# Patient Record
Sex: Female | Born: 1945 | Race: White | Hispanic: No | Marital: Married
Health system: Southern US, Community
[De-identification: ages and names within clinical notes are randomized; demographics above are authoritative.]

---

## 1997-09-19 ENCOUNTER — Ambulatory Visit (HOSPITAL_COMMUNITY): Admission: RE | Admit: 1997-09-19 | Discharge: 1997-09-19 | Payer: Self-pay | Admitting: Obstetrics and Gynecology

## 1998-03-27 ENCOUNTER — Other Ambulatory Visit: Admission: RE | Admit: 1998-03-27 | Discharge: 1998-03-27 | Payer: Self-pay | Admitting: Obstetrics and Gynecology

## 1998-09-23 ENCOUNTER — Ambulatory Visit (HOSPITAL_COMMUNITY): Admission: RE | Admit: 1998-09-23 | Discharge: 1998-09-23 | Payer: Self-pay | Admitting: Obstetrics and Gynecology

## 1998-09-23 ENCOUNTER — Other Ambulatory Visit: Admission: RE | Admit: 1998-09-23 | Discharge: 1998-09-23 | Payer: Self-pay | Admitting: Obstetrics and Gynecology

## 1998-09-23 ENCOUNTER — Encounter: Payer: Self-pay | Admitting: Obstetrics and Gynecology

## 1999-10-06 ENCOUNTER — Ambulatory Visit (HOSPITAL_COMMUNITY): Admission: RE | Admit: 1999-10-06 | Discharge: 1999-10-06 | Payer: Self-pay | Admitting: Obstetrics and Gynecology

## 1999-10-06 ENCOUNTER — Encounter: Payer: Self-pay | Admitting: Obstetrics and Gynecology

## 1999-10-06 ENCOUNTER — Other Ambulatory Visit: Admission: RE | Admit: 1999-10-06 | Discharge: 1999-10-06 | Payer: Self-pay | Admitting: Obstetrics and Gynecology

## 1999-10-30 ENCOUNTER — Other Ambulatory Visit: Admission: RE | Admit: 1999-10-30 | Discharge: 1999-10-30 | Payer: Self-pay | Admitting: Obstetrics and Gynecology

## 1999-10-30 ENCOUNTER — Encounter (INDEPENDENT_AMBULATORY_CARE_PROVIDER_SITE_OTHER): Payer: Self-pay | Admitting: Specialist

## 2000-03-04 ENCOUNTER — Ambulatory Visit (HOSPITAL_COMMUNITY): Admission: RE | Admit: 2000-03-04 | Discharge: 2000-03-04 | Payer: Self-pay | Admitting: Obstetrics and Gynecology

## 2000-03-04 ENCOUNTER — Encounter (INDEPENDENT_AMBULATORY_CARE_PROVIDER_SITE_OTHER): Payer: Self-pay

## 2000-10-20 ENCOUNTER — Encounter: Payer: Self-pay | Admitting: Obstetrics and Gynecology

## 2000-10-20 ENCOUNTER — Ambulatory Visit (HOSPITAL_COMMUNITY): Admission: RE | Admit: 2000-10-20 | Discharge: 2000-10-20 | Payer: Self-pay | Admitting: Obstetrics and Gynecology

## 2001-01-19 ENCOUNTER — Other Ambulatory Visit: Admission: RE | Admit: 2001-01-19 | Discharge: 2001-01-19 | Payer: Self-pay | Admitting: Family Medicine

## 2001-11-28 ENCOUNTER — Encounter: Payer: Self-pay | Admitting: Obstetrics and Gynecology

## 2001-11-28 ENCOUNTER — Ambulatory Visit (HOSPITAL_COMMUNITY): Admission: RE | Admit: 2001-11-28 | Discharge: 2001-11-28 | Payer: Self-pay | Admitting: Obstetrics and Gynecology

## 2002-01-22 ENCOUNTER — Other Ambulatory Visit: Admission: RE | Admit: 2002-01-22 | Discharge: 2002-01-22 | Payer: Self-pay | Admitting: Obstetrics and Gynecology

## 2003-02-07 ENCOUNTER — Ambulatory Visit (HOSPITAL_COMMUNITY): Admission: RE | Admit: 2003-02-07 | Discharge: 2003-02-07 | Payer: Self-pay | Admitting: Obstetrics and Gynecology

## 2003-02-07 ENCOUNTER — Other Ambulatory Visit: Admission: RE | Admit: 2003-02-07 | Discharge: 2003-02-07 | Payer: Self-pay | Admitting: Obstetrics and Gynecology

## 2003-03-20 ENCOUNTER — Other Ambulatory Visit: Admission: RE | Admit: 2003-03-20 | Discharge: 2003-03-20 | Payer: Self-pay | Admitting: Obstetrics and Gynecology

## 2003-09-16 ENCOUNTER — Other Ambulatory Visit: Admission: RE | Admit: 2003-09-16 | Discharge: 2003-09-16 | Payer: Self-pay | Admitting: Obstetrics and Gynecology

## 2004-03-19 ENCOUNTER — Ambulatory Visit (HOSPITAL_COMMUNITY): Admission: RE | Admit: 2004-03-19 | Discharge: 2004-03-19 | Payer: Self-pay | Admitting: Obstetrics and Gynecology

## 2004-03-26 ENCOUNTER — Other Ambulatory Visit: Admission: RE | Admit: 2004-03-26 | Discharge: 2004-03-26 | Payer: Self-pay | Admitting: Obstetrics and Gynecology

## 2004-09-24 ENCOUNTER — Other Ambulatory Visit: Admission: RE | Admit: 2004-09-24 | Discharge: 2004-09-24 | Payer: Self-pay | Admitting: Obstetrics and Gynecology

## 2005-03-24 ENCOUNTER — Ambulatory Visit (HOSPITAL_COMMUNITY): Admission: RE | Admit: 2005-03-24 | Discharge: 2005-03-24 | Payer: Self-pay | Admitting: Obstetrics and Gynecology

## 2005-04-16 ENCOUNTER — Other Ambulatory Visit: Admission: RE | Admit: 2005-04-16 | Discharge: 2005-04-16 | Payer: Self-pay | Admitting: Obstetrics and Gynecology

## 2006-04-07 ENCOUNTER — Ambulatory Visit (HOSPITAL_COMMUNITY): Admission: RE | Admit: 2006-04-07 | Discharge: 2006-04-07 | Payer: Self-pay | Admitting: Obstetrics and Gynecology

## 2006-07-04 ENCOUNTER — Other Ambulatory Visit: Admission: RE | Admit: 2006-07-04 | Discharge: 2006-07-04 | Payer: Self-pay | Admitting: Obstetrics and Gynecology

## 2007-02-15 ENCOUNTER — Other Ambulatory Visit: Admission: RE | Admit: 2007-02-15 | Discharge: 2007-02-15 | Payer: Self-pay | Admitting: Obstetrics and Gynecology

## 2007-04-13 ENCOUNTER — Ambulatory Visit (HOSPITAL_COMMUNITY): Admission: RE | Admit: 2007-04-13 | Discharge: 2007-04-13 | Payer: Self-pay | Admitting: Obstetrics and Gynecology

## 2007-07-05 ENCOUNTER — Other Ambulatory Visit: Admission: RE | Admit: 2007-07-05 | Discharge: 2007-07-05 | Payer: Self-pay | Admitting: Obstetrics and Gynecology

## 2007-07-17 ENCOUNTER — Encounter: Admission: RE | Admit: 2007-07-17 | Discharge: 2007-07-17 | Payer: Self-pay | Admitting: Obstetrics and Gynecology

## 2008-07-09 ENCOUNTER — Other Ambulatory Visit: Admission: RE | Admit: 2008-07-09 | Discharge: 2008-07-09 | Payer: Self-pay | Admitting: Obstetrics and Gynecology

## 2008-07-18 ENCOUNTER — Ambulatory Visit (HOSPITAL_COMMUNITY): Admission: RE | Admit: 2008-07-18 | Discharge: 2008-07-18 | Payer: Self-pay | Admitting: Obstetrics and Gynecology

## 2009-07-25 ENCOUNTER — Ambulatory Visit (HOSPITAL_COMMUNITY): Admission: RE | Admit: 2009-07-25 | Discharge: 2009-07-25 | Payer: Self-pay | Admitting: Obstetrics and Gynecology

## 2010-07-17 NOTE — H&P (Signed)
[facility]  Patient:    Leslie Hoffman, Leslie Hoffman                          MRN: [id number] Attending:  Beather Arbour. Thomasena Edis, M.D.                         History and Physical  HISTORY OF PRESENT ILLNESS:   The patient is a 65 year old gravida 2, para 2, Caucasian female who, in August, began to manifest postmenopausal bleeding; this occurred after the pharmacy substituted her hormone-replacement therapy; in fact, the substitution was for [facility] and it was not an Estratest-type product.  The patient subsequently continued to have daily bleeding, pink and bright red.  She subsequently underwent a sonohysterogram, which was negative. Endometrial biopsy was performed and showed blood and scantly weakly proliferative endometrium.  She continued to manifest daily bleeding despite her Estrace, Estratest and adequate doses of progesterone.  She is thus admitted for D&C/hysteroscopy to rule out endometrial hyperplasia.  PAST MEDICAL HISTORY:         History significant for hypertension, decreased HDL, cryosurgery, GERD and depression.  Patient has a history of genital warts and also underwent a D&C in 1976 and again in 1998 for postmenopausal bleeding and to remove endometrial polyps.  MEDICATIONS:                  1. Zocor 20 mg daily.                               2. HCTZ.                               3. Prevacid 30 mg daily.                               4. Paxil 20 mg daily.                               5. Full-strength Estratest one every other day.                               6. Estrace 1 mg every other day, which                                  alternates with the Estratest.                               7. Aygestin 2.5 mg daily.  FAMILY HISTORY:               Family history significant for diabetes mellitus in the patients father, controlled by diet, and history of a CVA in the patients father at age 54 which resulted in his death.  History of brother with  an MI who died at age 20 and mother with tuberculosis.  ALLERGIES:                    CODEINE causes nausea and I have explained this is not a  true allergy.  REVIEW OF SYSTEMS:            Negative.  PHYSICAL EXAMINATION  HEENT:                        Normal.  NECK:                         Supple without thyromegaly.  LUNGS:                        Clear to auscultation.  CARDIAC:                      Regular rate and rhythm.  ABDOMEN:                      Soft, nontender.  No hepatosplenomegaly.  PELVIC:                       Uterus approximately 8- to 9-week-size, anteverted, without any adnexal mass palpated.  RECTAL:                       Rectal is confirmatory, no mass.  ASSESSMENT AND PLAN:          The patient is a 65 year old gravida 2, para 2, white female with continued postmenopausal bleeding which began this summer when the pharmacy substituted the wrong hormone-replacement therapy.  Patient is admitted for dilatation and curettage and hysteroscopy.  Risks of surgery including anesthetic complications, hemorrhage or infection, damage to adjacent structures including bladder, bowel, blood vessels or ureters were discussed with patient.  She is made aware of the risks of uterine perforation which could result in overwhelming life-threatening hemorrhage or an emergent hysterectomy or uterine perforation which could result in bowel damage requiring emergent colostomy, or which could result in overwhelming life-threatening peritonitis.  Patient expresses understanding of and acceptance of these risks. DD:  03/04/00 TD:  03/04/00 Job: [id number]

## 2010-07-21 ENCOUNTER — Other Ambulatory Visit (HOSPITAL_COMMUNITY): Payer: Self-pay | Admitting: Pharmacist

## 2010-07-21 ENCOUNTER — Other Ambulatory Visit (HOSPITAL_COMMUNITY): Payer: Self-pay | Admitting: Family Medicine

## 2010-07-21 DIAGNOSIS — Z1231 Encounter for screening mammogram for malignant neoplasm of breast: Secondary | ICD-10-CM

## 2010-08-18 ENCOUNTER — Ambulatory Visit (HOSPITAL_COMMUNITY)
Admission: RE | Admit: 2010-08-18 | Discharge: 2010-08-18 | Disposition: A | Discharge status: Home / Self Care | Payer: Medicare Other | Source: Ambulatory Visit | Attending: Family Medicine | Admitting: Family Medicine

## 2010-08-18 DIAGNOSIS — Z1231 Encounter for screening mammogram for malignant neoplasm of breast: Secondary | ICD-10-CM

## 2010-10-27 ENCOUNTER — Ambulatory Visit (HOSPITAL_COMMUNITY): Payer: Medicare Other | Attending: Neurology

## 2010-10-27 DIAGNOSIS — G2581 Restless legs syndrome: Secondary | ICD-10-CM | POA: Insufficient documentation

## 2010-11-03 ENCOUNTER — Encounter (HOSPITAL_COMMUNITY): Payer: Medicare Other | Attending: Neurology

## 2010-11-03 DIAGNOSIS — G2581 Restless legs syndrome: Secondary | ICD-10-CM | POA: Insufficient documentation

## 2011-08-03 ENCOUNTER — Other Ambulatory Visit (HOSPITAL_COMMUNITY): Payer: Self-pay | Admitting: Obstetrics and Gynecology

## 2011-08-03 DIAGNOSIS — Z1231 Encounter for screening mammogram for malignant neoplasm of breast: Secondary | ICD-10-CM

## 2011-08-27 ENCOUNTER — Ambulatory Visit (HOSPITAL_COMMUNITY)
Admission: RE | Admit: 2011-08-27 | Discharge: 2011-08-27 | Disposition: A | Discharge status: Home / Self Care | Payer: Medicare Other | Source: Ambulatory Visit | Attending: Obstetrics and Gynecology | Admitting: Obstetrics and Gynecology

## 2011-08-27 DIAGNOSIS — Z1231 Encounter for screening mammogram for malignant neoplasm of breast: Secondary | ICD-10-CM | POA: Insufficient documentation

## 2012-08-16 ENCOUNTER — Other Ambulatory Visit (HOSPITAL_COMMUNITY): Payer: Self-pay | Admitting: Family Medicine

## 2012-08-16 DIAGNOSIS — Z1231 Encounter for screening mammogram for malignant neoplasm of breast: Secondary | ICD-10-CM

## 2012-09-20 ENCOUNTER — Ambulatory Visit (HOSPITAL_COMMUNITY)
Admission: RE | Admit: 2012-09-20 | Discharge: 2012-09-20 | Disposition: A | Discharge status: Home / Self Care | Payer: Medicare Other | Source: Ambulatory Visit | Attending: Family Medicine | Admitting: Family Medicine

## 2012-09-20 DIAGNOSIS — Z1231 Encounter for screening mammogram for malignant neoplasm of breast: Secondary | ICD-10-CM | POA: Insufficient documentation

## 2012-11-23 ENCOUNTER — Other Ambulatory Visit: Payer: Self-pay | Admitting: Neurology

## 2012-12-25 ENCOUNTER — Other Ambulatory Visit: Payer: Self-pay | Admitting: Neurology

## 2013-09-05 ENCOUNTER — Other Ambulatory Visit (HOSPITAL_COMMUNITY): Payer: Self-pay | Admitting: Family Medicine

## 2013-09-05 DIAGNOSIS — Z1231 Encounter for screening mammogram for malignant neoplasm of breast: Secondary | ICD-10-CM

## 2013-09-21 ENCOUNTER — Ambulatory Visit (HOSPITAL_COMMUNITY)
Admission: RE | Admit: 2013-09-21 | Discharge: 2013-09-21 | Disposition: A | Discharge status: Home / Self Care | Payer: Medicare Other | Source: Ambulatory Visit | Attending: Family Medicine | Admitting: Family Medicine

## 2013-09-21 DIAGNOSIS — Z1231 Encounter for screening mammogram for malignant neoplasm of breast: Secondary | ICD-10-CM | POA: Diagnosis present

## 2014-12-02 ENCOUNTER — Other Ambulatory Visit: Payer: Self-pay

## 2014-12-02 DIAGNOSIS — Z1231 Encounter for screening mammogram for malignant neoplasm of breast: Secondary | ICD-10-CM

## 2015-01-02 ENCOUNTER — Ambulatory Visit
Admission: RE | Admit: 2015-01-02 | Discharge: 2015-01-02 | Disposition: A | Discharge status: Home / Self Care | Payer: Medicare Other | Source: Ambulatory Visit

## 2015-01-02 DIAGNOSIS — Z1231 Encounter for screening mammogram for malignant neoplasm of breast: Secondary | ICD-10-CM

## 2015-02-10 ENCOUNTER — Telehealth: Payer: Self-pay | Admitting: Neurology

## 2015-03-02 HISTORY — PX: BREAST EXCISIONAL BIOPSY: SUR124

## 2015-12-18 ENCOUNTER — Other Ambulatory Visit: Payer: Self-pay | Admitting: Obstetrics and Gynecology

## 2015-12-18 DIAGNOSIS — Z1231 Encounter for screening mammogram for malignant neoplasm of breast: Secondary | ICD-10-CM

## 2016-02-03 ENCOUNTER — Ambulatory Visit
Admission: RE | Admit: 2016-02-03 | Discharge: 2016-02-03 | Disposition: A | Discharge status: Home / Self Care | Payer: Medicare Other | Source: Ambulatory Visit | Attending: Obstetrics and Gynecology | Admitting: Obstetrics and Gynecology

## 2016-02-03 DIAGNOSIS — Z1231 Encounter for screening mammogram for malignant neoplasm of breast: Secondary | ICD-10-CM

## 2017-01-03 ENCOUNTER — Other Ambulatory Visit: Payer: Self-pay | Admitting: Family Medicine

## 2017-01-03 DIAGNOSIS — Z1231 Encounter for screening mammogram for malignant neoplasm of breast: Secondary | ICD-10-CM

## 2017-02-09 ENCOUNTER — Ambulatory Visit: Payer: Medicare Other

## 2017-03-09 ENCOUNTER — Ambulatory Visit
Admission: RE | Admit: 2017-03-09 | Discharge: 2017-03-09 | Disposition: A | Discharge status: Home / Self Care | Payer: Medicare HMO | Source: Ambulatory Visit | Attending: Family Medicine | Admitting: Family Medicine

## 2017-03-09 DIAGNOSIS — Z1231 Encounter for screening mammogram for malignant neoplasm of breast: Secondary | ICD-10-CM

## 2018-03-15 ENCOUNTER — Other Ambulatory Visit: Payer: Self-pay | Admitting: Family Medicine

## 2018-03-15 DIAGNOSIS — Z1231 Encounter for screening mammogram for malignant neoplasm of breast: Secondary | ICD-10-CM

## 2018-03-23 ENCOUNTER — Ambulatory Visit
Admission: RE | Admit: 2018-03-23 | Discharge: 2018-03-23 | Disposition: A | Discharge status: Home / Self Care | Payer: Medicare HMO | Source: Ambulatory Visit | Attending: Family Medicine | Admitting: Family Medicine

## 2018-03-23 DIAGNOSIS — Z1231 Encounter for screening mammogram for malignant neoplasm of breast: Secondary | ICD-10-CM

## 2019-03-23 ENCOUNTER — Other Ambulatory Visit: Payer: Self-pay | Admitting: Family Medicine

## 2019-03-23 DIAGNOSIS — Z1231 Encounter for screening mammogram for malignant neoplasm of breast: Secondary | ICD-10-CM

## 2019-05-08 ENCOUNTER — Ambulatory Visit: Payer: Medicare HMO

## 2019-05-28 ENCOUNTER — Ambulatory Visit
Admission: RE | Admit: 2019-05-28 | Discharge: 2019-05-28 | Disposition: A | Discharge status: Home / Self Care | Payer: Medicare HMO | Source: Ambulatory Visit | Attending: Family Medicine | Admitting: Family Medicine

## 2019-05-28 ENCOUNTER — Other Ambulatory Visit: Payer: Self-pay

## 2019-05-28 DIAGNOSIS — Z1231 Encounter for screening mammogram for malignant neoplasm of breast: Secondary | ICD-10-CM

## 2020-06-27 ENCOUNTER — Other Ambulatory Visit: Payer: Self-pay | Admitting: Family Medicine

## 2020-06-27 DIAGNOSIS — Z1231 Encounter for screening mammogram for malignant neoplasm of breast: Secondary | ICD-10-CM

## 2020-08-18 ENCOUNTER — Other Ambulatory Visit: Payer: Self-pay

## 2020-08-18 ENCOUNTER — Ambulatory Visit
Admission: RE | Admit: 2020-08-18 | Discharge: 2020-08-18 | Disposition: A | Discharge status: Home / Self Care | Payer: Medicare HMO | Source: Ambulatory Visit | Attending: Family Medicine | Admitting: Family Medicine

## 2020-08-18 DIAGNOSIS — Z1231 Encounter for screening mammogram for malignant neoplasm of breast: Secondary | ICD-10-CM

## 2021-07-20 ENCOUNTER — Other Ambulatory Visit: Payer: Self-pay | Admitting: Family Medicine

## 2021-07-20 DIAGNOSIS — Z1231 Encounter for screening mammogram for malignant neoplasm of breast: Secondary | ICD-10-CM

## 2021-08-19 ENCOUNTER — Ambulatory Visit
Admission: RE | Admit: 2021-08-19 | Discharge: 2021-08-19 | Disposition: A | Discharge status: Home / Self Care | Payer: Medicare HMO | Source: Ambulatory Visit | Attending: Family Medicine | Admitting: Family Medicine

## 2021-08-19 DIAGNOSIS — Z1231 Encounter for screening mammogram for malignant neoplasm of breast: Secondary | ICD-10-CM

## 2022-08-10 ENCOUNTER — Other Ambulatory Visit: Payer: Self-pay | Admitting: Obstetrics and Gynecology

## 2022-08-10 DIAGNOSIS — Z1231 Encounter for screening mammogram for malignant neoplasm of breast: Secondary | ICD-10-CM

## 2022-09-20 ENCOUNTER — Ambulatory Visit: Admission: RE | Admit: 2022-09-20 | Payer: Medicare HMO | Source: Ambulatory Visit

## 2022-09-20 DIAGNOSIS — Z1231 Encounter for screening mammogram for malignant neoplasm of breast: Secondary | ICD-10-CM

## 2023-08-22 ENCOUNTER — Other Ambulatory Visit: Payer: Self-pay | Admitting: Obstetrics and Gynecology

## 2023-08-22 DIAGNOSIS — Z1231 Encounter for screening mammogram for malignant neoplasm of breast: Secondary | ICD-10-CM

## 2023-09-21 ENCOUNTER — Ambulatory Visit
Admission: RE | Admit: 2023-09-21 | Discharge: 2023-09-21 | Disposition: A | Discharge status: Home / Self Care | Source: Ambulatory Visit | Attending: Obstetrics and Gynecology | Admitting: Obstetrics and Gynecology

## 2023-09-21 DIAGNOSIS — Z1231 Encounter for screening mammogram for malignant neoplasm of breast: Secondary | ICD-10-CM

## 2024-01-23 IMAGING — MG MM DIGITAL SCREENING BILAT W/ TOMO AND CAD
8 series · 8 of 24 positions shown · non-contrast
Comparison: Previous exam(s).

CLINICAL DATA: Screening.

EXAM:
DIGITAL SCREENING BILATERAL MAMMOGRAM WITH TOMOSYNTHESIS AND CAD
TECHNIQUE: Bilateral screening digital craniocaudal and mediolateral oblique
mammograms were obtained. Bilateral screening digital breast
tomosynthesis was performed. The images were evaluated with
computer-aided detection.

[R CC synth-2D]
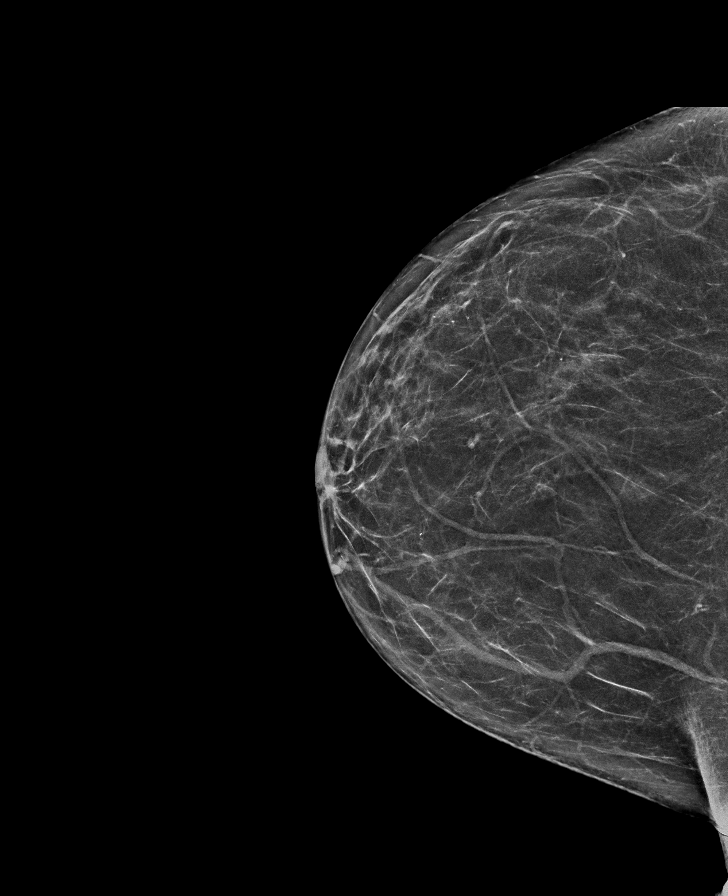

[L CC synth-2D]
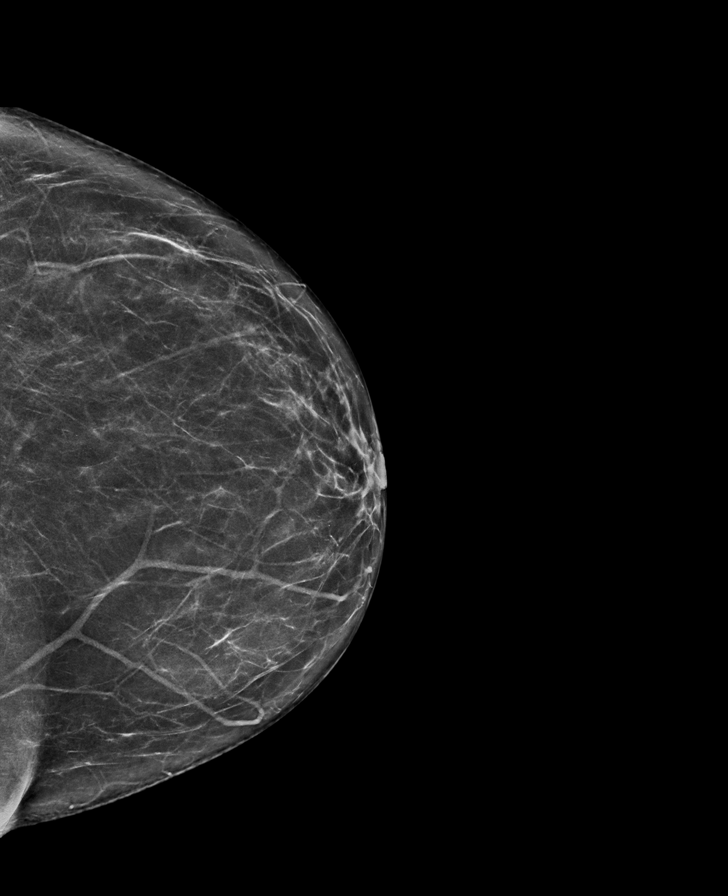

[R MLO synth-2D]
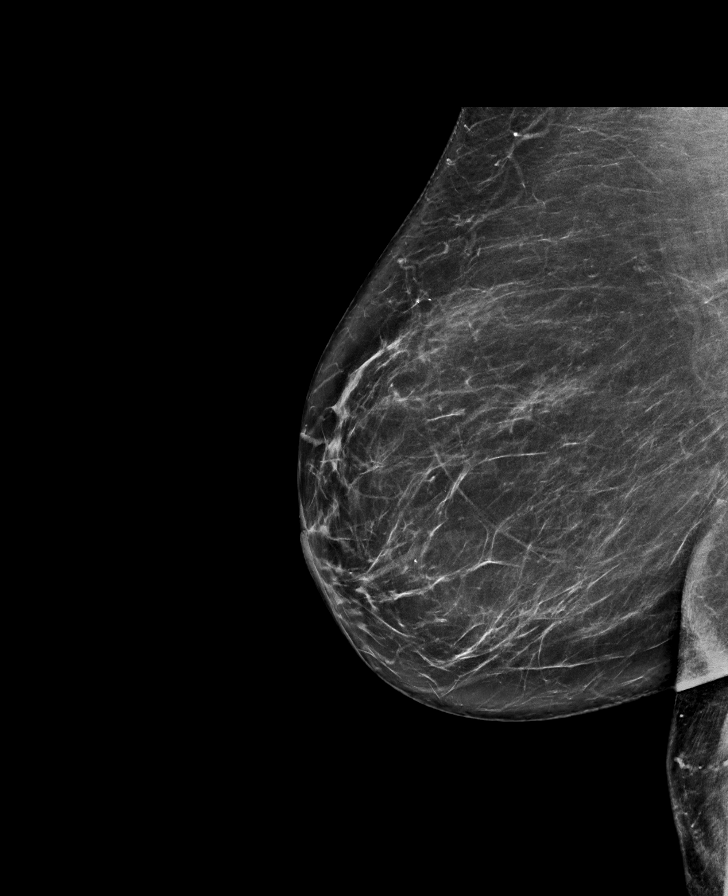

[L MLO synth-2D]
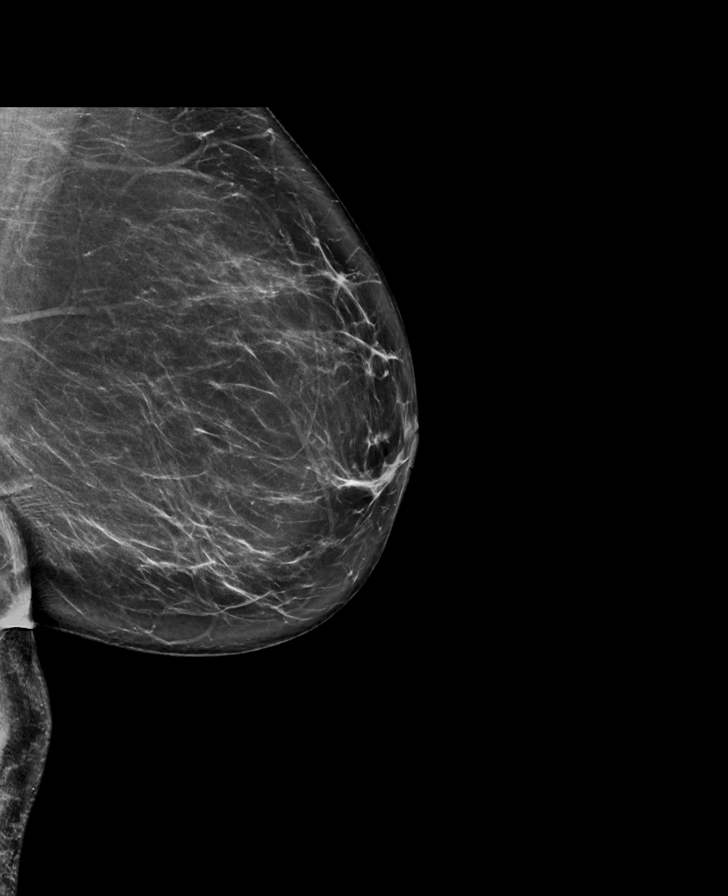

[R CC tomo · tomo slice 34/67.0]
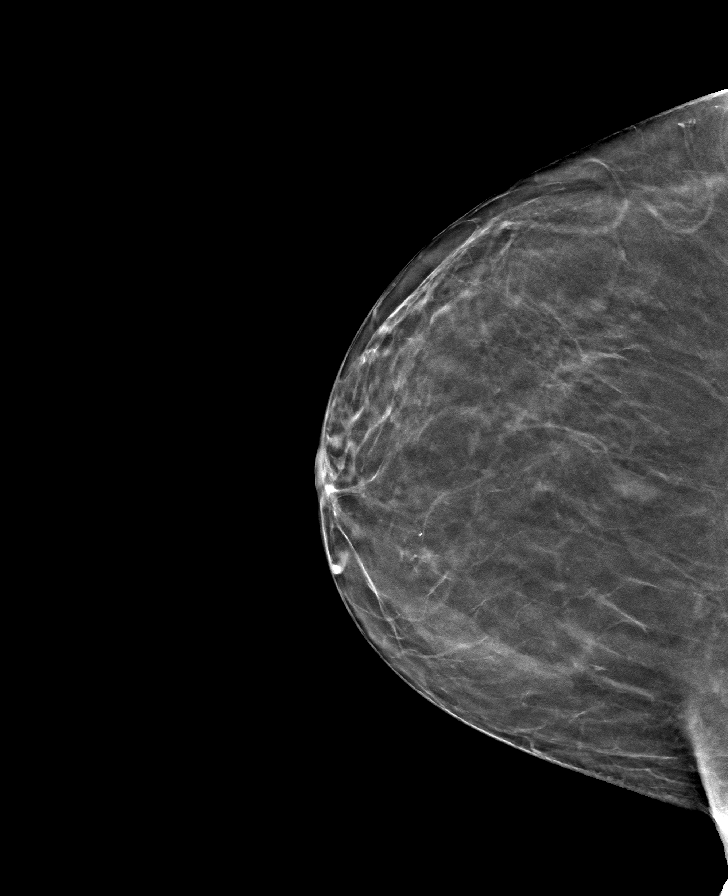

[R MLO tomo · tomo slice 40/79.0]
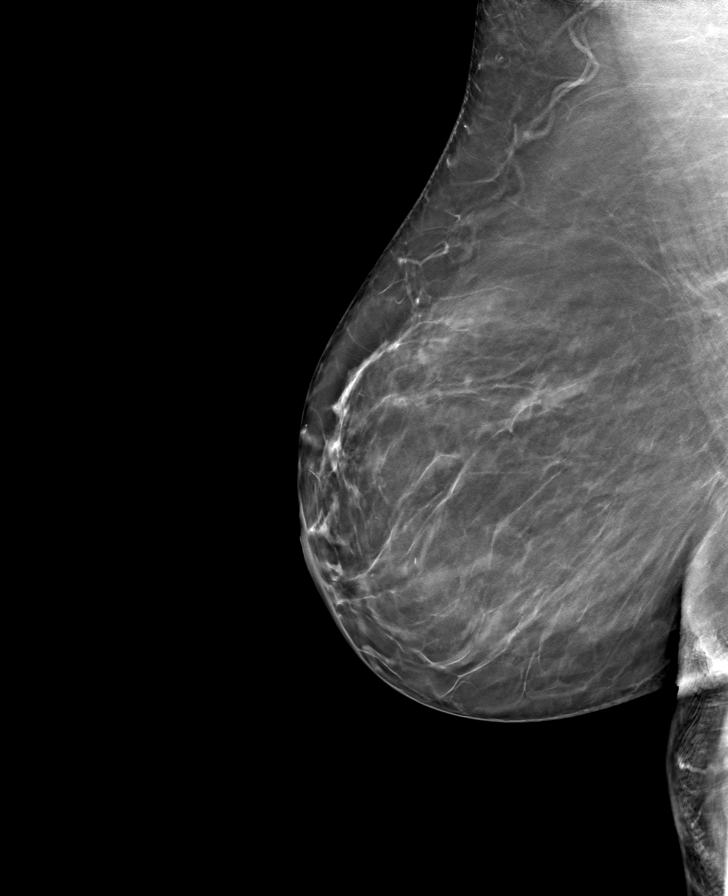

[L MLO tomo · tomo slice 42/83.0]
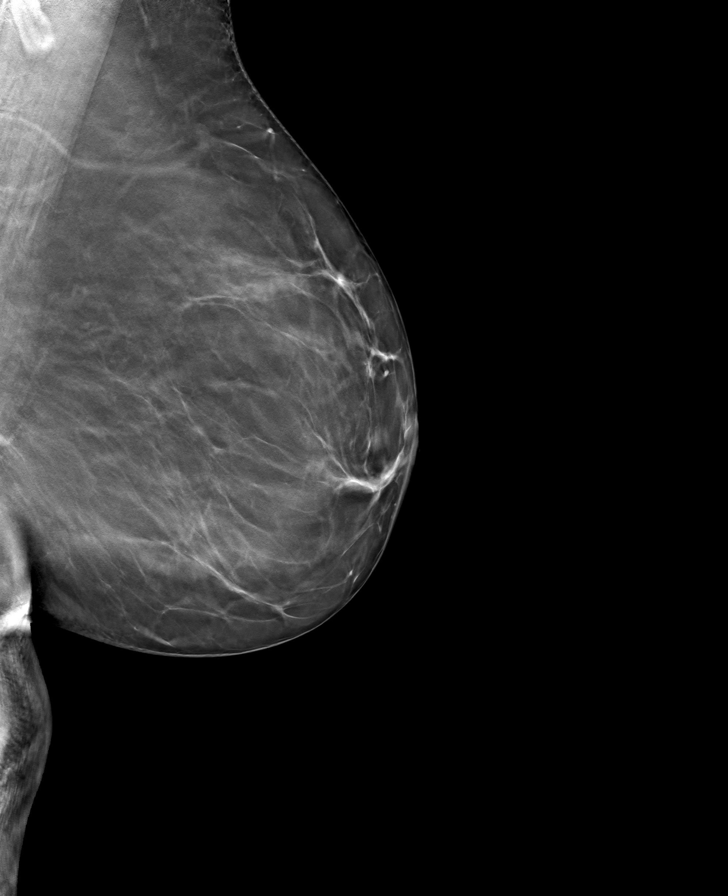

[L CC tomo · tomo slice 36/71.0]
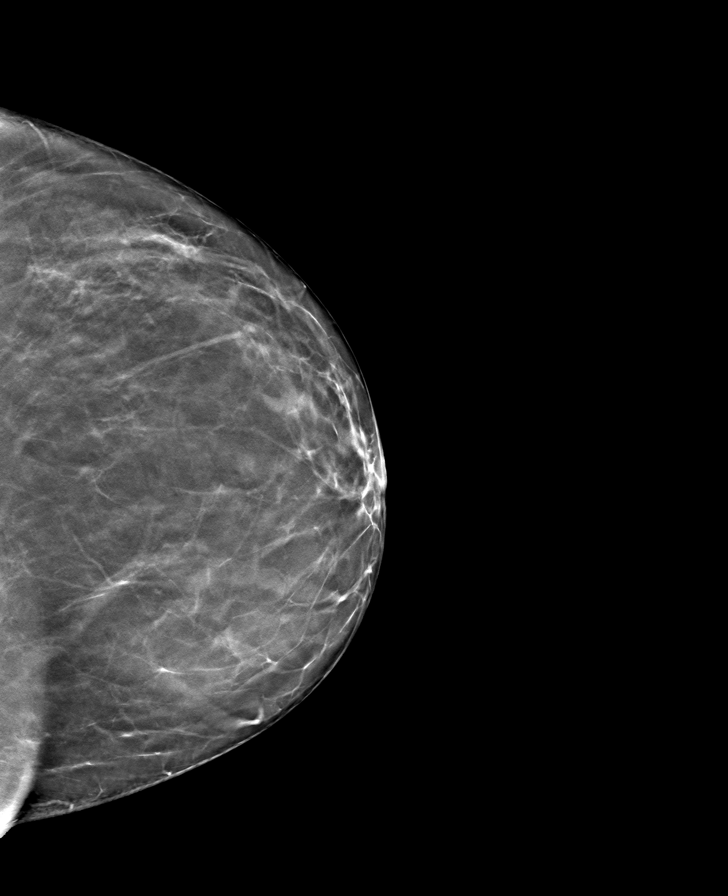

[8 of 24 positions shown; findings below may reference images not displayed]

ACR Breast Density Category b: There are scattered areas of
fibroglandular density.
FINDINGS: There are no findings suspicious for malignancy.
IMPRESSION: No mammographic evidence of malignancy. A result letter of this
screening mammogram will be mailed directly to the patient.

RECOMMENDATION:
Screening mammogram in one year. (Code:51-O-LD2)

BI-RADS CATEGORY  1: Negative.
